# Patient Record
Sex: Female | Born: 1980 | Race: White | Hispanic: No | Smoking: Current every day smoker
Health system: Southern US, Community
[De-identification: ages and names within clinical notes are randomized; demographics above are authoritative.]

## PROBLEM LIST (undated history)

## (undated) DIAGNOSIS — F32A Depression, unspecified: Secondary | ICD-10-CM

## (undated) DIAGNOSIS — B009 Herpesviral infection, unspecified: Secondary | ICD-10-CM

## (undated) DIAGNOSIS — K219 Gastro-esophageal reflux disease without esophagitis: Secondary | ICD-10-CM

## (undated) DIAGNOSIS — F419 Anxiety disorder, unspecified: Secondary | ICD-10-CM

## (undated) DIAGNOSIS — F329 Major depressive disorder, single episode, unspecified: Secondary | ICD-10-CM

## (undated) HISTORY — PX: MUSCLE FLAP CLOSURE: SHX2054

## (undated) HISTORY — PX: MUSCLE TRANSFER UPPER ARM: SHX2057

## (undated) HISTORY — PX: CHOLECYSTECTOMY: SHX55

## (undated) HISTORY — PX: TUBAL LIGATION: SHX77

## (undated) HISTORY — PX: ARTERY REPAIR: SHX559

## (undated) HISTORY — PX: BACK SURGERY: SHX140

---

## 1998-01-18 ENCOUNTER — Other Ambulatory Visit: Admission: RE | Admit: 1998-01-18 | Discharge: 1998-01-18 | Payer: Self-pay | Admitting: Obstetrics and Gynecology

## 1998-03-04 ENCOUNTER — Inpatient Hospital Stay (HOSPITAL_COMMUNITY): Admission: AD | Admit: 1998-03-04 | Discharge: 1998-03-04 | Payer: Self-pay | Admitting: Obstetrics and Gynecology

## 1998-03-15 ENCOUNTER — Ambulatory Visit (HOSPITAL_COMMUNITY): Admission: RE | Admit: 1998-03-15 | Discharge: 1998-03-15 | Payer: Self-pay | Admitting: Obstetrics and Gynecology

## 1998-05-30 ENCOUNTER — Ambulatory Visit (HOSPITAL_COMMUNITY): Admission: RE | Admit: 1998-05-30 | Discharge: 1998-05-30 | Payer: Self-pay | Admitting: Obstetrics and Gynecology

## 1998-07-26 ENCOUNTER — Encounter (HOSPITAL_COMMUNITY): Admission: RE | Admit: 1998-07-26 | Discharge: 1998-08-22 | Payer: Self-pay | Admitting: Obstetrics and Gynecology

## 1998-08-19 ENCOUNTER — Inpatient Hospital Stay (HOSPITAL_COMMUNITY): Admission: AD | Admit: 1998-08-19 | Discharge: 1998-08-22 | Payer: Self-pay | Admitting: Obstetrics and Gynecology

## 2000-03-15 ENCOUNTER — Other Ambulatory Visit: Admission: RE | Admit: 2000-03-15 | Discharge: 2000-03-15 | Payer: Self-pay | Admitting: Obstetrics and Gynecology

## 2000-05-17 ENCOUNTER — Encounter: Payer: Self-pay | Admitting: Obstetrics and Gynecology

## 2000-05-17 ENCOUNTER — Ambulatory Visit (HOSPITAL_COMMUNITY): Admission: RE | Admit: 2000-05-17 | Discharge: 2000-05-17 | Payer: Self-pay | Admitting: Obstetrics and Gynecology

## 2000-06-20 ENCOUNTER — Encounter: Payer: Self-pay | Admitting: Obstetrics and Gynecology

## 2000-06-20 ENCOUNTER — Ambulatory Visit (HOSPITAL_COMMUNITY): Admission: RE | Admit: 2000-06-20 | Discharge: 2000-06-20 | Payer: Self-pay | Admitting: Obstetrics and Gynecology

## 2000-09-13 ENCOUNTER — Encounter: Payer: Self-pay | Admitting: Obstetrics and Gynecology

## 2000-09-13 ENCOUNTER — Ambulatory Visit (HOSPITAL_COMMUNITY): Admission: RE | Admit: 2000-09-13 | Discharge: 2000-09-13 | Payer: Self-pay | Admitting: Obstetrics and Gynecology

## 2000-10-08 ENCOUNTER — Inpatient Hospital Stay (HOSPITAL_COMMUNITY): Admission: AD | Admit: 2000-10-08 | Discharge: 2000-10-10 | Payer: Self-pay | Admitting: Obstetrics and Gynecology

## 2001-03-20 ENCOUNTER — Other Ambulatory Visit: Admission: RE | Admit: 2001-03-20 | Discharge: 2001-03-20 | Payer: Self-pay | Admitting: Obstetrics and Gynecology

## 2002-07-14 ENCOUNTER — Other Ambulatory Visit: Admission: RE | Admit: 2002-07-14 | Discharge: 2002-07-14 | Payer: Self-pay | Admitting: Obstetrics and Gynecology

## 2003-07-08 ENCOUNTER — Other Ambulatory Visit: Admission: RE | Admit: 2003-07-08 | Discharge: 2003-07-08 | Payer: Self-pay | Admitting: Obstetrics and Gynecology

## 2003-09-17 ENCOUNTER — Inpatient Hospital Stay (HOSPITAL_COMMUNITY): Admission: AD | Admit: 2003-09-17 | Discharge: 2003-09-17 | Payer: Self-pay | Admitting: Obstetrics and Gynecology

## 2004-01-05 ENCOUNTER — Inpatient Hospital Stay (HOSPITAL_COMMUNITY): Admission: AD | Admit: 2004-01-05 | Discharge: 2004-01-06 | Payer: Self-pay | Admitting: Obstetrics and Gynecology

## 2005-06-29 ENCOUNTER — Ambulatory Visit: Payer: Self-pay | Admitting: Family Medicine

## 2005-07-13 ENCOUNTER — Ambulatory Visit: Payer: Self-pay | Admitting: Family Medicine

## 2005-08-27 ENCOUNTER — Ambulatory Visit (HOSPITAL_COMMUNITY): Admission: RE | Admit: 2005-08-27 | Discharge: 2005-08-27 | Payer: Self-pay | Admitting: Obstetrics and Gynecology

## 2005-11-26 ENCOUNTER — Ambulatory Visit: Payer: Self-pay | Admitting: Family Medicine

## 2005-11-30 ENCOUNTER — Ambulatory Visit: Payer: Self-pay | Admitting: Family Medicine

## 2005-12-28 ENCOUNTER — Ambulatory Visit: Payer: Self-pay | Admitting: Family Medicine

## 2006-04-08 ENCOUNTER — Ambulatory Visit: Payer: Self-pay | Admitting: Family Medicine

## 2006-05-02 ENCOUNTER — Ambulatory Visit: Payer: Self-pay | Admitting: Family Medicine

## 2006-07-15 ENCOUNTER — Ambulatory Visit: Payer: Self-pay | Admitting: Family Medicine

## 2006-07-16 ENCOUNTER — Ambulatory Visit: Payer: Self-pay | Admitting: Family Medicine

## 2006-12-16 ENCOUNTER — Ambulatory Visit: Payer: Self-pay | Admitting: Family Medicine
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
## 2010-11-10 NOTE — Op Note (Signed)
Eileen Macias, Eileen Macias                  ACCOUNT NO.:  [id number]   MEDICAL RECORD NO.:  [id number]          PATIENT TYPE:  AMB   LOCATION:  DAY                           FACILITY:  [facility]   PHYSICIAN:  Tilda Burrow, M.D. DATE OF BIRTH:  Oct 19, 1980   DATE OF PROCEDURE:  08/27/2005  DATE OF DISCHARGE:                                 OPERATIVE REPORT   PREOPERATIVE DIAGNOSES:  1.  Menorrhagia.  2.  Uterine retroversion.  3.  Elective sterilization.  4.  Dyspareunia.   POSTOPERATIVE DIAGNOSES:  1.  Menorrhagia.  2.  Uterine retroversion.  3.  Elective sterilization.  4.  Dyspareunia.   PROCEDURE:  Vaginal hysterectomy.   SURGEON:  Dr. Emelda Fear.   ASSISTANT:  Lewellyn, C.S.T., Whitt, C.S.T.   ANESTHESIA:  General.   COMPLICATIONS:  None.   FINDINGS:  Retroverted uterus, good vaginal wall and bladder support. Normal  appearing ovaries.   DETAILS OF PROCEDURE:  The patient was taken to the operating room, prepped  and draped for vaginal procedure with legs elevated in high lithotomy candy-  cane stirrups. Peritoneum was prepped, covered and vaginal bib in place.  Weighted 3-inch speculum was placed in the posterior fornix, cervix grasped  with Lahey thyroid tenaculum, anterior cervical vaginal fornix infiltrated  with Marcaine with epinephrine, then opened with Bovie cautery sufficiently  to elevate the bladder. It was retracted with a curved 1-inch retractor. We  were not able to enter the peritoneal cavity anteriorly but were able to  elevate the bladder quite nicely. Posteriorly, a posterior colpotomy  incision was made, entering the peritoneal cavity without difficulty. Small  bites were taken across the uterosacral ligaments on either side with curved  Zeppelin clamps with clamping with Zeppelin clamps followed by transection  with Mayo scissors and 0 chromic suture ligature with tagging of the initial  clampings. We then marched up the side to the uterus, taken down the  lower  cardinal ligaments and upper cardinal ligaments serially in small bites  using Zeppelin clamps, Mayo scissors and 0 chromic suture ligature. After  three bites on either side, we could enter the peritoneal cavity easily  anteriorly and then marched up the broad ligament serially using the same  technique. Once we reached the level of the utero-ovarian ligaments and  round ligaments on either side, the uterine fundus was morcellated,  amputating off the cervix and lower uterine segment for improved visibility.  We then cross clamped the remaining pedicle on the right side, transected it  and 0 chromic ligature used for double tying this. The opposite pedicle was  treated similarly, and the surgical specimen removed. Pedicles were quite  hemostatic. Consideration was given to pulling the uterosacral ligaments  together in the midline. A 2-0 Prolene suture was placed through them and  tagged. We then inspected the cuff and found all of the oozing was coming  from the posterior vaginal cuff, something that could be dealt with as the  cuff was closed. The peritoneum was closed, pulling the bladder flap  peritoneum posteriorly and tacking it in a continuous  running fashion just  above the posterior vaginal cuff. The uterosacral ligaments were pulled  closer together with the Prolene suture, but this resulted in some  inflexibility of the vaginal cuff, and so the stitch was disrupted. The  uterosacral ligaments were the sutured to the posterior vaginal cuff,  approximately 1 cm away from the end of the uterosacral ligament. This had  the effect of elevating the posterior fornix. The cuff itself was then  closed in a continuous running fashion with 2-0 chromic, resulting good  hemostasis, good _pelvic__ support and adequate flexibility to reduce the  likelihood of future discomfort/dyspareunia. The ovarian pedicles had been  previously been inspected, confirmed as hemostatic, released into  the upper  abdomen and were well out of the area of the cuff at the end of the surgery.  EBL was 150 cc. Sponge and needle counts were correct at the end of the  procedure. The patient had received IV Cleocin as prophylaxis prior to the  procedure. She went to recovery room in excellent condition.      Tilda Burrow, M.D.  Electronically Signed     JVF/MEDQ  D:  08/27/2005  T:  08/28/2005  Job:  [id number]   cc:   Donna Bernard, M.D.  Fax: [phone]
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
## 2016-08-21 ENCOUNTER — Emergency Department (HOSPITAL_COMMUNITY)
Admission: EM | Admit: 2016-08-21 | Discharge: 2016-08-21 | Disposition: A | Discharge status: Home / Self Care | Payer: Medicaid Other | Attending: Emergency Medicine | Admitting: Emergency Medicine

## 2016-08-21 ENCOUNTER — Encounter (HOSPITAL_COMMUNITY): Payer: Self-pay | Admitting: *Deleted

## 2016-08-21 DIAGNOSIS — G8929 Other chronic pain: Secondary | ICD-10-CM | POA: Diagnosis not present

## 2016-08-21 DIAGNOSIS — M5441 Lumbago with sciatica, right side: Secondary | ICD-10-CM | POA: Diagnosis not present

## 2016-08-21 DIAGNOSIS — M545 Low back pain: Secondary | ICD-10-CM | POA: Diagnosis present

## 2016-08-21 DIAGNOSIS — Z79899 Other long term (current) drug therapy: Secondary | ICD-10-CM | POA: Diagnosis not present

## 2016-08-21 DIAGNOSIS — M5442 Lumbago with sciatica, left side: Secondary | ICD-10-CM | POA: Diagnosis not present

## 2016-08-21 MED ORDER — DIAZEPAM 5 MG PO TABS
10.0000 mg | ORAL_TABLET | Freq: Once | ORAL | Status: AC
  Administered 2016-08-21: 10 mg via ORAL
  Filled 2016-08-21: qty 2

## 2016-08-21 MED ORDER — HYDROMORPHONE HCL 2 MG/ML IJ SOLN
0.5000 mg | Freq: Once | INTRAMUSCULAR | Status: AC
  Administered 2016-08-21: 0.5 mg via INTRAVENOUS
  Filled 2016-08-21: qty 1

## 2016-08-21 MED ORDER — HYDROMORPHONE HCL 2 MG/ML IJ SOLN
1.0000 mg | Freq: Once | INTRAMUSCULAR | Status: AC
  Administered 2016-08-21: 1 mg via INTRAVENOUS
  Filled 2016-08-21: qty 1

## 2016-08-21 MED ORDER — DIAZEPAM 5 MG PO TABS: 5.0000 mg | ORAL_TABLET | Freq: Two times a day (BID) | ORAL | 0 refills | Status: DC

## 2016-08-21 MED ORDER — KETOROLAC TROMETHAMINE 30 MG/ML IJ SOLN
15.0000 mg | Freq: Once | INTRAMUSCULAR | Status: AC
  Administered 2016-08-21: 15 mg via INTRAVENOUS
  Filled 2016-08-21: qty 1

## 2016-08-21 NOTE — ED Notes (Signed)
Pt reinformed about the need for urine. Pt states she will try in a little bit

## 2016-08-21 NOTE — ED Provider Notes (Signed)
[facility]-EMERGENCY DEPT Provider Note   CSN: [id number] Arrival date & time: 08/21/16  4098     History   Chief Complaint No chief complaint on file.   HPI Eileen Macias is a 36 y.o. female.  Patient with a history of chronic low back pain with previous surgery, followed by [facility] for epidural injections, presents with episode severe bilateral low back pain with radiation bilaterally to LE's. He pain is usually right sided. No falls or injury. She reports less bladder control but this is not new tonight. No abdominal pain, fever, nausea.    The history is provided by the patient and the spouse. No language interpreter was used.    History reviewed. No pertinent past medical history.  There are no active problems to display for this patient.   Past Surgical History:  Procedure Laterality Date  . ARTERY REPAIR    . BACK SURGERY    . CHOLECYSTECTOMY    . MUSCLE FLAP CLOSURE    . TUBAL LIGATION      OB History    No data available       Home Medications    Prior to Admission medications   Medication Sig Start Date End Date Taking? Authorizing Provider  albuterol (PROVENTIL HFA;VENTOLIN HFA) 108 (90 Base) MCG/ACT inhaler Inhale 2-3 puffs into the lungs every 6 (six) hours as needed for wheezing.    Yes Historical Provider, MD  ALPRAZolam Prudy Feeler) 0.5 MG tablet Take 0.5 mg by mouth 3 (three) times daily as needed for anxiety.  07/04/16  Yes Historical Provider, MD  citalopram (CELEXA) 40 MG tablet Take 40 mg by mouth daily. 06/19/16  Yes Historical Provider, MD  clobetasol (TEMOVATE) 0.05 % external solution Apply 1 application topically daily. 08/06/16  Yes Historical Provider, MD  gabapentin (NEURONTIN) 300 MG capsule Take 300 mg by mouth 2 (two) times daily. 08/07/16  Yes Historical Provider, MD  HYDROcodone-acetaminophen (NORCO) 10-325 MG tablet Take 1 tablet by mouth 4 (four) times daily as needed for moderate pain.  07/05/16  Yes Historical Provider, MD  ibuprofen  (ADVIL,MOTRIN) 200 MG tablet Take 800 mg by mouth every 6 (six) hours as needed for headache or moderate pain.   Yes Historical Provider, MD  ketoconazole (NIZORAL) 2 % shampoo Apply 1 application topically daily. 08/06/16  Yes Historical Provider, MD  mometasone-formoterol (DULERA) 100-5 MCG/ACT AERO Inhale 1 puff into the lungs daily as needed for wheezing.  06/26/16  Yes Historical Provider, MD  phentermine (ADIPEX-P) 37.5 MG tablet Take 37.5 mg by mouth daily before breakfast.   Yes Historical Provider, MD  valACYclovir (VALTREX) 500 MG tablet Take 500 mg by mouth daily. 07/27/16  Yes Historical Provider, MD  VESICARE 10 MG tablet Take 10 mg by mouth daily. 07/27/16  Yes Historical Provider, MD    Family History No family history on file.  Social History Social History  Substance Use Topics  . Smoking status: Never Smoker  . Smokeless tobacco: Never Used  . Alcohol use No     Allergies   Patient has no known allergies.   Review of Systems Review of Systems  Constitutional: Negative for chills and fever.  HENT: Negative.   Respiratory: Negative.  Negative for shortness of breath.   Cardiovascular: Negative.  Negative for chest pain.  Gastrointestinal: Negative.  Negative for abdominal pain and nausea.  Musculoskeletal: Positive for back pain (with radiation to bilateral legs).  Skin: Negative.   Neurological: Negative.  Negative for numbness and headaches.  Physical Exam Updated Vital Signs BP 112/56   Pulse 80   Temp 98.6 F (37 C) (Oral)   Resp 24   LMP 08/06/2016   SpO2 100%   Physical Exam  Constitutional: She is oriented to person, place, and time. She appears well-developed and well-nourished.  HENT:  Head: Normocephalic.  Neck: Normal range of motion. Neck supple.  Cardiovascular: Normal rate and regular rhythm.   Pulmonary/Chest: Effort normal and breath sounds normal.  Abdominal: Soft. Bowel sounds are normal. There is no tenderness. There is no rebound  and no guarding.  Musculoskeletal: Normal range of motion. She exhibits no edema.  Tender across lower back greatest at midline. No swelling.  Neurological: She is alert and oriented to person, place, and time.  No discrepancy in DTR's LE's.   Skin: Skin is warm and dry.  Psychiatric: She has a normal mood and affect.     ED Treatments / Results  Labs (all labs ordered are listed, but only abnormal results are displayed) Labs Reviewed  PREGNANCY, URINE  URINALYSIS, ROUTINE W REFLEX MICROSCOPIC   No results found for this or any previous visit.  EKG  EKG Interpretation None       Radiology No results found.  Procedures Procedures (including critical care time)  Medications Ordered in ED Medications  HYDROmorphone (DILAUDID) injection 1 mg (1 mg Intravenous Given 08/21/16 0410)  diazepam (VALIUM) tablet 10 mg (10 mg Oral Given 08/21/16 0415)     Initial Impression / Assessment and Plan / ED Course  I have reviewed the triage vital signs and the nursing notes.  Pertinent labs & imaging results that were available during my care of the patient were reviewed by me and considered in my medical decision making (see chart for details).     Patient with chronic low back pain presents with uncontrolled pain. No neurologic deficits. Pain is better with pain medications.   Chart reviewed. She had an MRI on 08/16/16 showing right disc bulging. Per patient, her doctor wants to refer her to pain management.   She is ambulatory to and from the bathroom. She is felt stable for discharge home.   Final Clinical Impressions(s) / ED Diagnoses   Final diagnoses:  None   1. Acute on chronic low back pain  New Prescriptions New Prescriptions   No medications on file     Elpidio AnisShari Hiedi Touchton, Cordelia Poche-C 08/21/16 [id number]    Glynn OctaveStephen Rancour, MD 08/21/16 [phone]

## 2016-08-21 NOTE — ED Triage Notes (Signed)
Pt c/o lower chronic back pain x 1 month. Has been taking hydrocodone, steroids, and gabapentin without relief.

## 2016-08-21 NOTE — ED Notes (Signed)
Pt verbalized understanding of d/c instructions and has no further questions. Pt is stable, A&Ox4, VSS.  

## 2017-02-16 ENCOUNTER — Emergency Department (HOSPITAL_COMMUNITY)
Admission: EM | Admit: 2017-02-16 | Discharge: 2017-02-16 | Disposition: A | Discharge status: Home / Self Care | Payer: Medicaid Other | Attending: Emergency Medicine | Admitting: Emergency Medicine

## 2017-02-16 ENCOUNTER — Encounter (HOSPITAL_COMMUNITY): Payer: Self-pay | Admitting: Emergency Medicine

## 2017-02-16 DIAGNOSIS — Y9389 Activity, other specified: Secondary | ICD-10-CM | POA: Diagnosis not present

## 2017-02-16 DIAGNOSIS — Z79899 Other long term (current) drug therapy: Secondary | ICD-10-CM | POA: Diagnosis not present

## 2017-02-16 DIAGNOSIS — X58XXXA Exposure to other specified factors, initial encounter: Secondary | ICD-10-CM | POA: Insufficient documentation

## 2017-02-16 DIAGNOSIS — N760 Acute vaginitis: Secondary | ICD-10-CM | POA: Insufficient documentation

## 2017-02-16 DIAGNOSIS — Y998 Other external cause status: Secondary | ICD-10-CM | POA: Diagnosis not present

## 2017-02-16 DIAGNOSIS — N898 Other specified noninflammatory disorders of vagina: Secondary | ICD-10-CM | POA: Diagnosis present

## 2017-02-16 DIAGNOSIS — N72 Inflammatory disease of cervix uteri: Secondary | ICD-10-CM | POA: Diagnosis not present

## 2017-02-16 DIAGNOSIS — Y929 Unspecified place or not applicable: Secondary | ICD-10-CM | POA: Insufficient documentation

## 2017-02-16 DIAGNOSIS — T192XXA Foreign body in vulva and vagina, initial encounter: Secondary | ICD-10-CM | POA: Diagnosis not present

## 2017-02-16 LAB — WET PREP, GENITAL
Sperm: NONE SEEN
Trich, Wet Prep: NONE SEEN
Yeast Wet Prep HPF POC: NONE SEEN

## 2017-02-16 LAB — POC URINE PREG, ED: Preg Test, Ur: NEGATIVE

## 2017-02-16 MED ORDER — LIDOCAINE HCL (PF) 1 % IJ SOLN
INTRAMUSCULAR | Status: AC
  Administered 2017-02-16: 10:00:00
  Filled 2017-02-16: qty 30

## 2017-02-16 MED ORDER — AZITHROMYCIN 250 MG PO TABS
1000.0000 mg | ORAL_TABLET | Freq: Once | ORAL | Status: AC
  Administered 2017-02-16: 1000 mg via ORAL
  Filled 2017-02-16: qty 4

## 2017-02-16 MED ORDER — CEFTRIAXONE SODIUM 250 MG IJ SOLR
250.0000 mg | Freq: Once | INTRAMUSCULAR | Status: AC
  Administered 2017-02-16: 250 mg via INTRAMUSCULAR
  Filled 2017-02-16: qty 250

## 2017-02-16 MED ORDER — METRONIDAZOLE 500 MG PO TABS: 500.0000 mg | ORAL_TABLET | Freq: Two times a day (BID) | ORAL | 0 refills | Status: AC

## 2017-02-16 NOTE — Discharge Instructions (Signed)
Please be mindful to remove any tampons in a timely fashion that you place into your vagina. Use a condom each time that you have sex. Keep your scheduled plan with your gynecologist next month

## 2017-02-16 NOTE — ED Notes (Signed)
Restricted extremity- lt arm. [employer]

## 2017-02-16 NOTE — ED Triage Notes (Signed)
Pt reports lower midline abd and vaginal pain for the past 2 weeks. Accompanied by foul smelling discharge and pain with intercourse. LMP 2 weeks ago.
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
## 2017-02-17 LAB — RPR: RPR Ser Ql: NONREACTIVE

## 2017-02-17 LAB — HIV ANTIBODY (ROUTINE TESTING W REFLEX): HIV SCREEN 4TH GENERATION: NONREACTIVE

## 2017-02-18 LAB — GC/CHLAMYDIA PROBE AMP (~~LOC~~) NOT AT ARMC
CHLAMYDIA, DNA PROBE: NEGATIVE
Neisseria Gonorrhea: NEGATIVE

## 2017-04-11 ENCOUNTER — Emergency Department (HOSPITAL_COMMUNITY): Payer: Medicaid Other

## 2017-04-11 ENCOUNTER — Emergency Department (HOSPITAL_COMMUNITY)
Admission: EM | Admit: 2017-04-11 | Discharge: 2017-04-11 | Disposition: A | Discharge status: Home / Self Care | Payer: Medicaid Other | Attending: Emergency Medicine | Admitting: Emergency Medicine

## 2017-04-11 ENCOUNTER — Encounter (HOSPITAL_COMMUNITY): Payer: Self-pay | Admitting: Emergency Medicine

## 2017-04-11 DIAGNOSIS — R0789 Other chest pain: Secondary | ICD-10-CM | POA: Diagnosis not present

## 2017-04-11 DIAGNOSIS — Y9389 Activity, other specified: Secondary | ICD-10-CM | POA: Diagnosis not present

## 2017-04-11 DIAGNOSIS — S52502A Unspecified fracture of the lower end of left radius, initial encounter for closed fracture: Secondary | ICD-10-CM | POA: Insufficient documentation

## 2017-04-11 DIAGNOSIS — S59912A Unspecified injury of left forearm, initial encounter: Secondary | ICD-10-CM | POA: Insufficient documentation

## 2017-04-11 DIAGNOSIS — Z79899 Other long term (current) drug therapy: Secondary | ICD-10-CM | POA: Insufficient documentation

## 2017-04-11 DIAGNOSIS — R55 Syncope and collapse: Secondary | ICD-10-CM | POA: Insufficient documentation

## 2017-04-11 DIAGNOSIS — S299XXA Unspecified injury of thorax, initial encounter: Secondary | ICD-10-CM | POA: Diagnosis present

## 2017-04-11 DIAGNOSIS — Y999 Unspecified external cause status: Secondary | ICD-10-CM | POA: Diagnosis not present

## 2017-04-11 DIAGNOSIS — S52602A Unspecified fracture of lower end of left ulna, initial encounter for closed fracture: Secondary | ICD-10-CM | POA: Diagnosis not present

## 2017-04-11 DIAGNOSIS — S3991XA Unspecified injury of abdomen, initial encounter: Secondary | ICD-10-CM | POA: Diagnosis not present

## 2017-04-11 DIAGNOSIS — F17210 Nicotine dependence, cigarettes, uncomplicated: Secondary | ICD-10-CM | POA: Insufficient documentation

## 2017-04-11 DIAGNOSIS — Y92410 Unspecified street and highway as the place of occurrence of the external cause: Secondary | ICD-10-CM | POA: Insufficient documentation

## 2017-04-11 DIAGNOSIS — S199XXA Unspecified injury of neck, initial encounter: Secondary | ICD-10-CM | POA: Insufficient documentation

## 2017-04-11 HISTORY — DX: Major depressive disorder, single episode, unspecified: F32.9

## 2017-04-11 HISTORY — DX: Anxiety disorder, unspecified: F41.9

## 2017-04-11 HISTORY — DX: Herpesviral infection, unspecified: B00.9

## 2017-04-11 HISTORY — DX: Gastro-esophageal reflux disease without esophagitis: K21.9

## 2017-04-11 HISTORY — DX: Depression, unspecified: F32.A

## 2017-04-11 LAB — I-STAT CHEM 8, ED
BUN: 6 mg/dL (ref 6–20)
CREATININE: 0.7 mg/dL (ref 0.44–1.00)
Calcium, Ion: 1.12 mmol/L — ABNORMAL LOW (ref 1.15–1.40)
Chloride: 102 mmol/L (ref 101–111)
Glucose, Bld: 81 mg/dL (ref 65–99)
HEMATOCRIT: 40 % (ref 36.0–46.0)
Hemoglobin: 13.6 g/dL (ref 12.0–15.0)
POTASSIUM: 3.2 mmol/L — AB (ref 3.5–5.1)
Sodium: 139 mmol/L (ref 135–145)
TCO2: 25 mmol/L (ref 22–32)

## 2017-04-11 MED ORDER — SODIUM CHLORIDE 0.9 % IV BOLUS (SEPSIS)
500.0000 mL | Freq: Once | INTRAVENOUS | Status: AC
  Administered 2017-04-11: 500 mL via INTRAVENOUS

## 2017-04-11 MED ORDER — LORAZEPAM 2 MG/ML IJ SOLN
0.5000 mg | Freq: Once | INTRAMUSCULAR | Status: AC
  Administered 2017-04-11: 0.5 mg via INTRAVENOUS
  Filled 2017-04-11: qty 1

## 2017-04-11 MED ORDER — ONDANSETRON HCL 4 MG/2ML IJ SOLN
4.0000 mg | Freq: Once | INTRAMUSCULAR | Status: AC
  Administered 2017-04-11: 4 mg via INTRAVENOUS
  Filled 2017-04-11: qty 2

## 2017-04-11 MED ORDER — HYDROMORPHONE HCL 1 MG/ML IJ SOLN
0.5000 mg | Freq: Once | INTRAMUSCULAR | Status: AC
  Administered 2017-04-11: 0.5 mg via INTRAVENOUS
  Filled 2017-04-11: qty 1

## 2017-04-11 MED ORDER — IOPAMIDOL (ISOVUE-300) INJECTION 61%
100.0000 mL | Freq: Once | INTRAVENOUS | Status: AC | PRN
  Administered 2017-04-11: 100 mL via INTRAVENOUS
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
## 2017-04-11 NOTE — Discharge Instructions (Signed)
Follow-up with your orthopedic doctor next week. If you cannot see him he can see Dr. Romeo AppleHarrison

## 2017-04-11 NOTE — ED Notes (Signed)
Patient transported to CT 
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
## 2017-04-12 ENCOUNTER — Telehealth: Payer: Self-pay | Admitting: Orthopedic Surgery

## 2017-04-12 NOTE — Telephone Encounter (Signed)
Patient is scheduled to see Dr. Romeo AppleHarrison on Monday 10/22. In verifing her Medicaid I found a different PCP listed than what is in our system. I have spent a half hour trying to contact PCP and get authorization for our office to see her. I have ran into several issues trying to contact someone to help with this. I called the PCP's office listed in our system and she is their patient, but they are not listed on her card or on the Medicaid web site. I have called and explained all of this to the patient. I told her that she takes a chance of being responsible for all charges unless she can get in touch with someone at the PCP's office that is listed on her card and get authorization sent to us. She stated she understood and would try to work on this and get it straightened out.

## 2017-04-15 ENCOUNTER — Encounter: Payer: Self-pay | Admitting: Orthopedic Surgery

## 2017-04-15 ENCOUNTER — Ambulatory Visit: Payer: Medicaid Other | Admitting: Orthopedic Surgery
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
## 2019-07-27 ENCOUNTER — Other Ambulatory Visit: Payer: Self-pay

## 2019-07-27 ENCOUNTER — Encounter (HOSPITAL_COMMUNITY): Payer: Self-pay | Admitting: Emergency Medicine

## 2019-07-27 ENCOUNTER — Emergency Department (HOSPITAL_COMMUNITY)
Admission: EM | Admit: 2019-07-27 | Discharge: 2019-07-27 | Disposition: A | Discharge status: Home / Self Care | Payer: Medicaid Other | Attending: Emergency Medicine | Admitting: Emergency Medicine

## 2019-07-27 ENCOUNTER — Emergency Department (HOSPITAL_COMMUNITY): Payer: Medicaid Other

## 2019-07-27 DIAGNOSIS — Z5321 Procedure and treatment not carried out due to patient leaving prior to being seen by health care provider: Secondary | ICD-10-CM | POA: Diagnosis not present

## 2019-07-27 DIAGNOSIS — R079 Chest pain, unspecified: Secondary | ICD-10-CM | POA: Insufficient documentation

## 2019-07-27 DIAGNOSIS — M549 Dorsalgia, unspecified: Secondary | ICD-10-CM | POA: Diagnosis not present

## 2019-07-27 DIAGNOSIS — M542 Cervicalgia: Secondary | ICD-10-CM | POA: Diagnosis not present

## 2019-07-27 DIAGNOSIS — M25511 Pain in right shoulder: Secondary | ICD-10-CM | POA: Insufficient documentation

## 2019-07-27 MED ORDER — SODIUM CHLORIDE 0.9% FLUSH: 3.0000 mL | Freq: Once | INTRAVENOUS | Status: DC

## 2019-07-27 NOTE — ED Triage Notes (Signed)
Pt c/o chest pain since yesterday that is constant and radiates to back and right shoulder and neck.

## 2020-11-15 IMAGING — DX DG CHEST 2V
2 series · 2 of 2 positions shown · non-contrast
Comparison: Chest CT report dated 04/11/2017. The images are not
available for direct comparison.

CLINICAL DATA: 38-year-old female with chest pain.

EXAM:
CHEST - 2 VIEW

[chest pa]
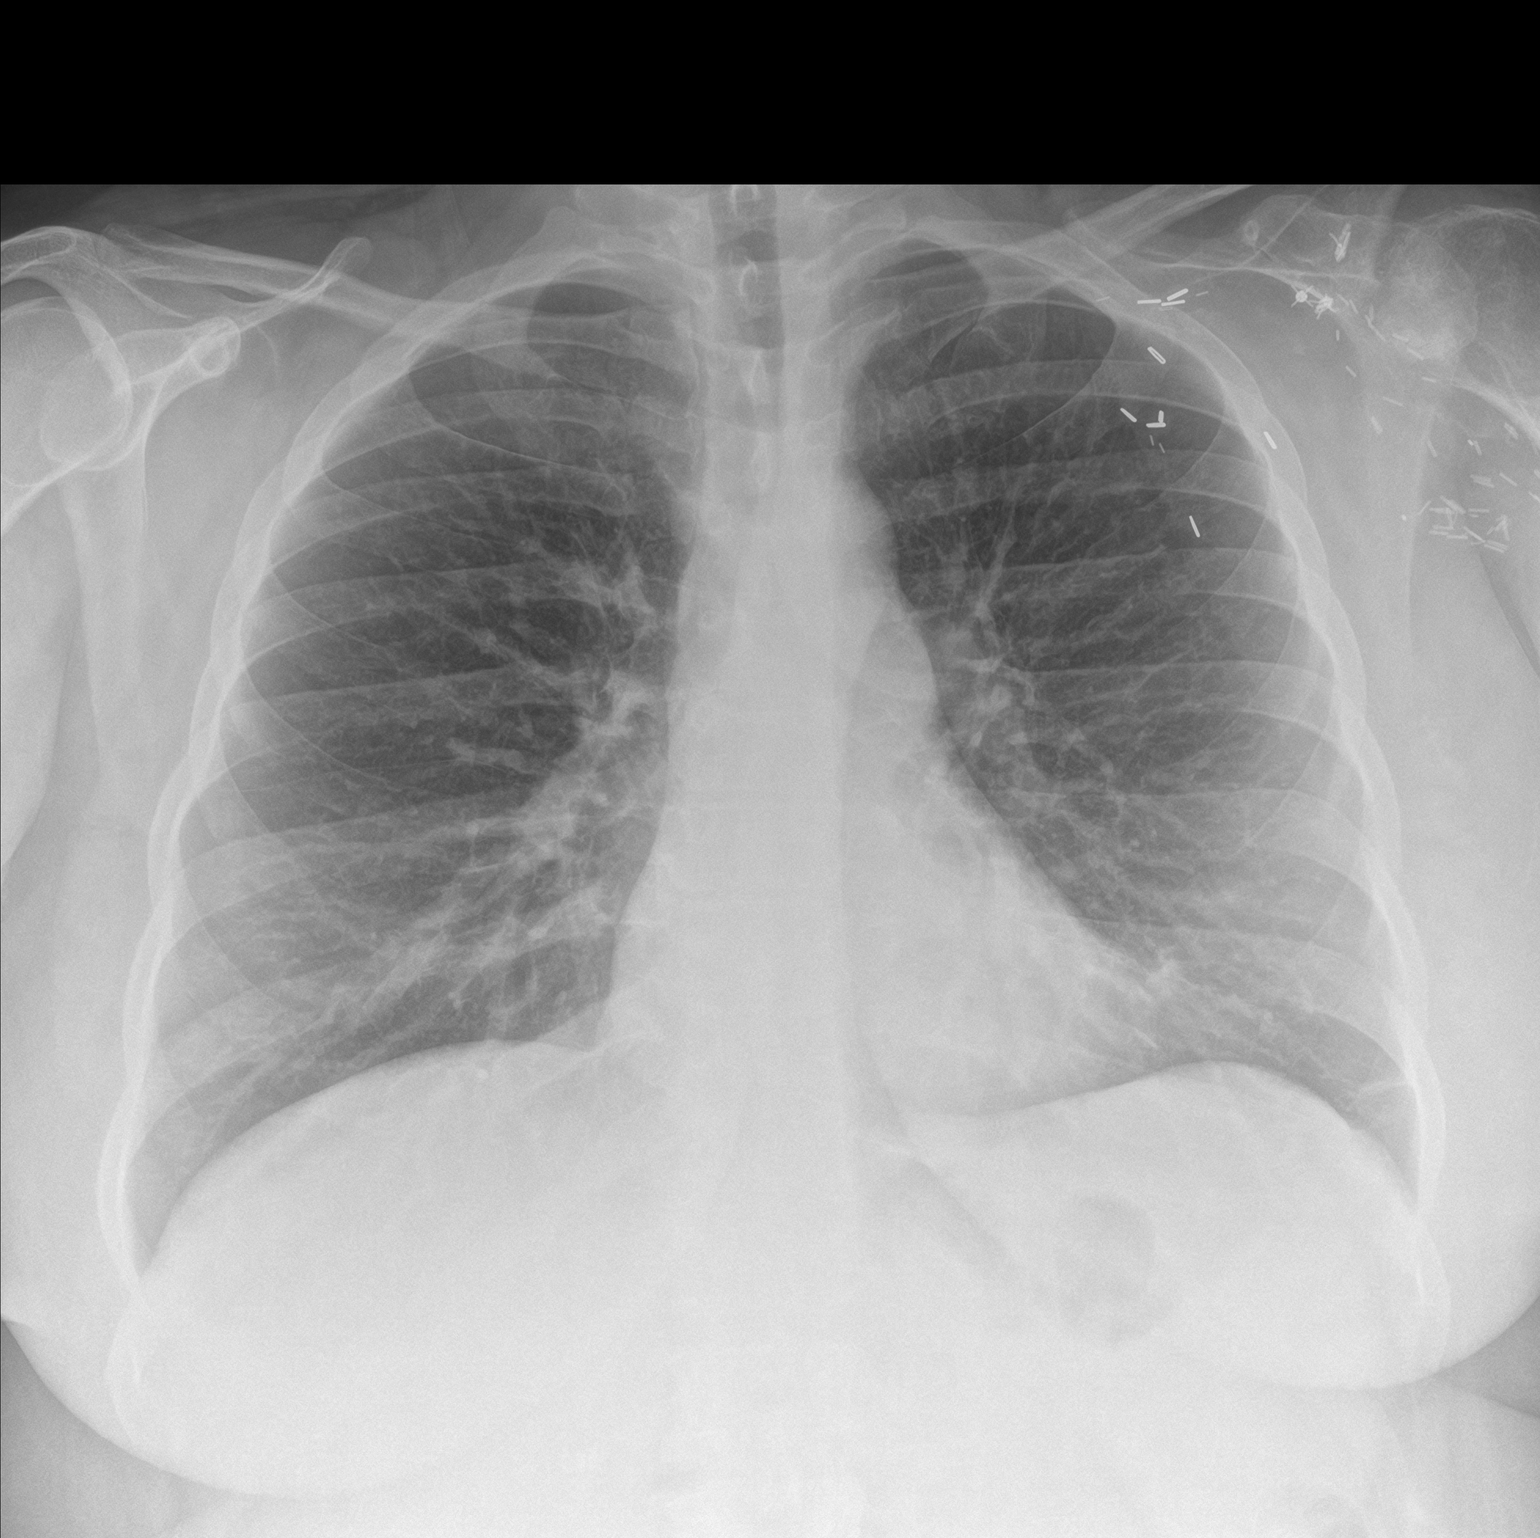

[chest lat]
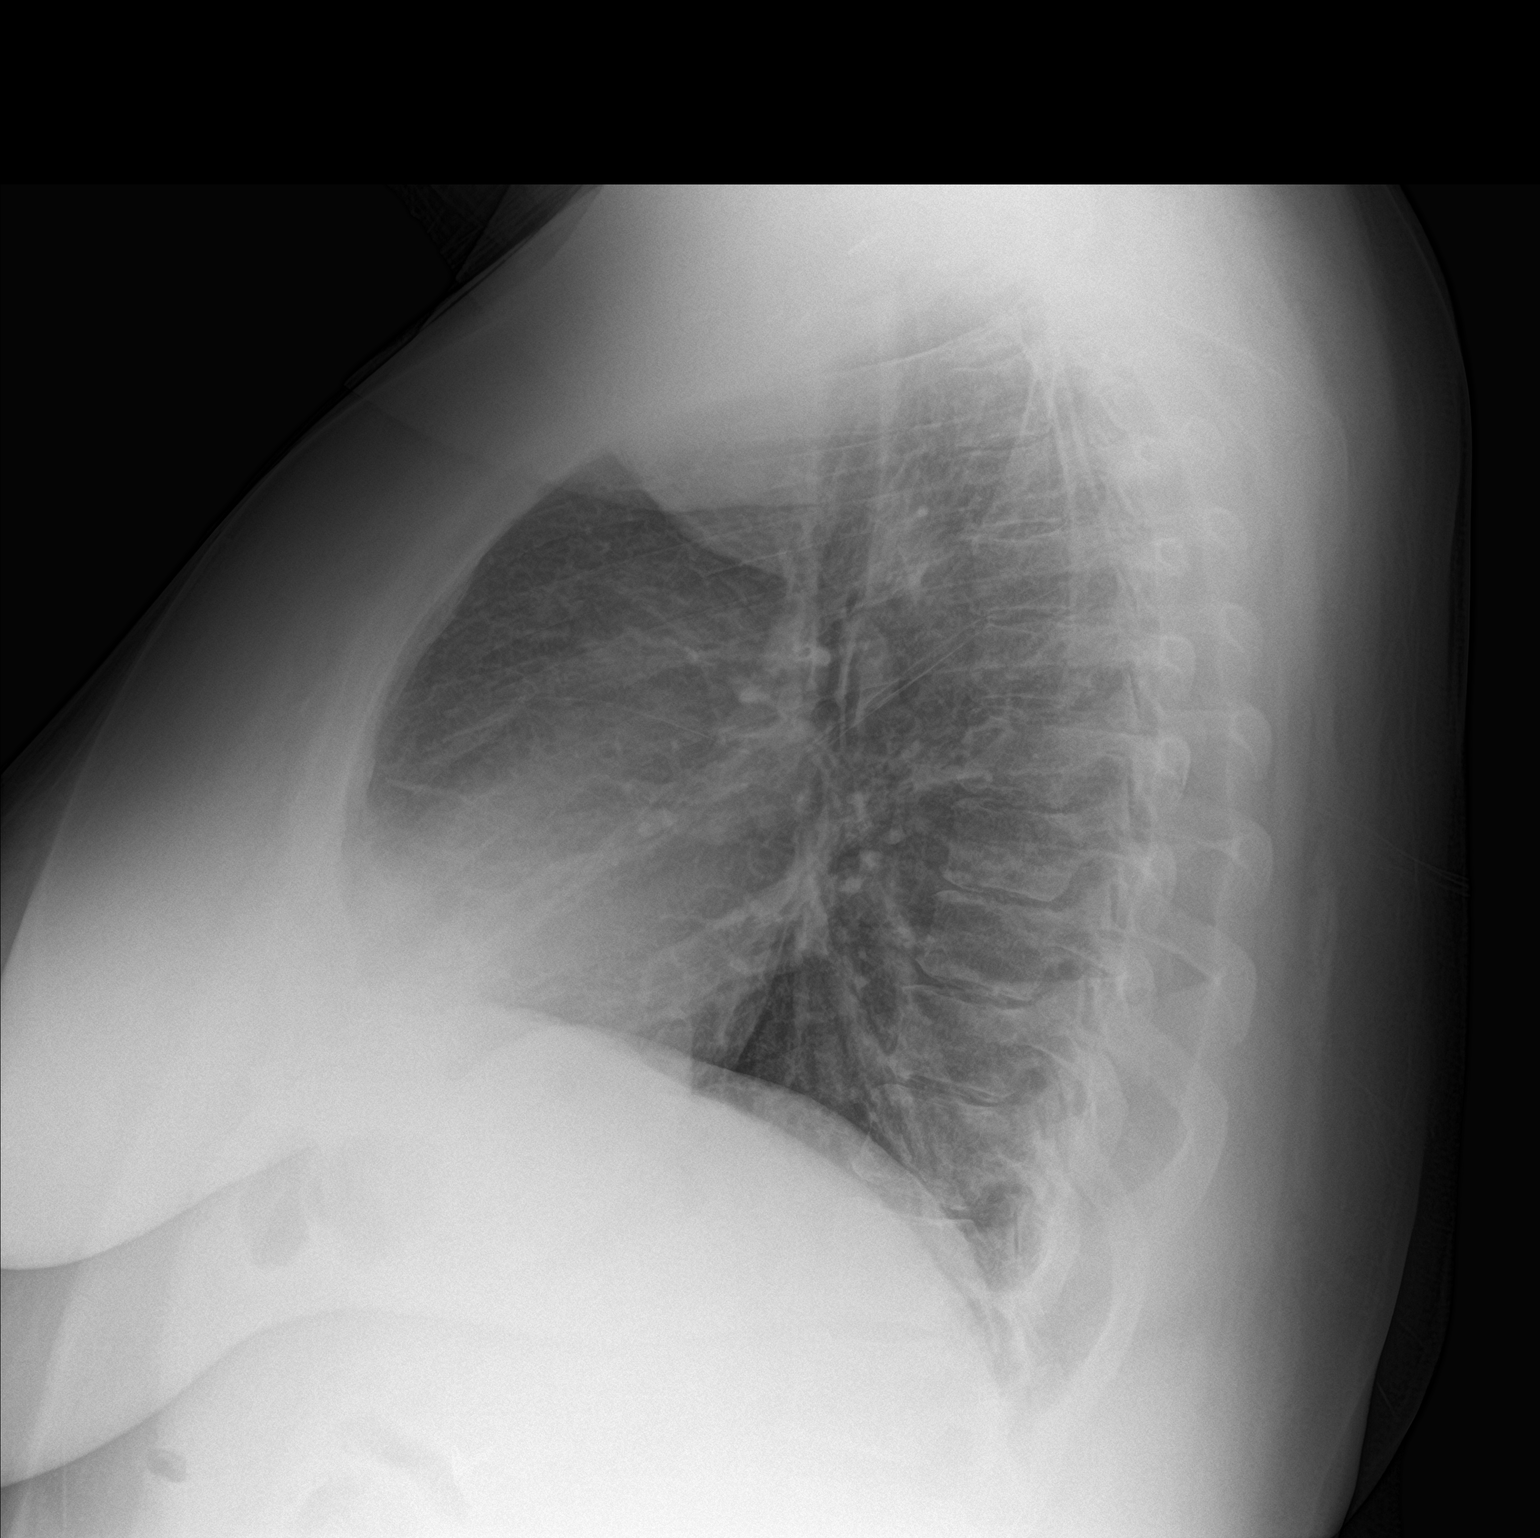

[2 of 2 positions shown; findings below may reference images not displayed]

FINDINGS: No focal consolidation, pleural effusion, or pneumothorax. The
cardiac silhouette is within normal limits. No acute osseous
pathology. Multiple surgical clips noted over the left upper chest.
IMPRESSION: No active cardiopulmonary disease.
